# Patient Record
Sex: Male | Born: 1995 | Race: Black or African American | Hispanic: No | Marital: Single
Health system: Southern US, Community
[De-identification: ages and names within clinical notes are randomized; demographics above are authoritative.]

## PROBLEM LIST (undated history)

## (undated) DIAGNOSIS — K5090 Crohn's disease, unspecified, without complications: Secondary | ICD-10-CM

## (undated) DIAGNOSIS — J45909 Unspecified asthma, uncomplicated: Secondary | ICD-10-CM

---
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
## 2022-08-15 ENCOUNTER — Other Ambulatory Visit: Payer: Self-pay

## 2022-08-15 ENCOUNTER — Emergency Department (HOSPITAL_COMMUNITY): Payer: Self-pay

## 2022-08-15 ENCOUNTER — Emergency Department (HOSPITAL_COMMUNITY)
Admission: EM | Admit: 2022-08-15 | Discharge: 2022-08-15 | Disposition: A | Discharge status: Home / Self Care | Payer: Self-pay | Attending: Emergency Medicine | Admitting: Emergency Medicine

## 2022-08-15 ENCOUNTER — Encounter (HOSPITAL_COMMUNITY): Payer: Self-pay | Admitting: Emergency Medicine

## 2022-08-15 DIAGNOSIS — N131 Hydronephrosis with ureteral stricture, not elsewhere classified: Secondary | ICD-10-CM | POA: Insufficient documentation

## 2022-08-15 DIAGNOSIS — N201 Calculus of ureter: Secondary | ICD-10-CM

## 2022-08-15 LAB — CBC WITH DIFFERENTIAL/PLATELET
Abs Immature Granulocytes: 0.01 10*3/uL (ref 0.00–0.07)
Basophils Absolute: 0 10*3/uL (ref 0.0–0.1)
Basophils Relative: 0 %
Eosinophils Absolute: 0.1 10*3/uL (ref 0.0–0.5)
Eosinophils Relative: 2 %
HCT: 42.2 % (ref 39.0–52.0)
Hemoglobin: 14.5 g/dL (ref 13.0–17.0)
Immature Granulocytes: 0 %
Lymphocytes Relative: 21 %
Lymphs Abs: 1.1 10*3/uL (ref 0.7–4.0)
MCH: 32 pg (ref 26.0–34.0)
MCHC: 34.4 g/dL (ref 30.0–36.0)
MCV: 93.2 fL (ref 80.0–100.0)
Monocytes Absolute: 0.5 10*3/uL (ref 0.1–1.0)
Monocytes Relative: 9 %
Neutro Abs: 3.4 10*3/uL (ref 1.7–7.7)
Neutrophils Relative %: 68 %
Platelets: 246 10*3/uL (ref 150–400)
RBC: 4.53 MIL/uL (ref 4.22–5.81)
RDW: 12.4 % (ref 11.5–15.5)
WBC: 5 10*3/uL (ref 4.0–10.5)
nRBC: 0 % (ref 0.0–0.2)

## 2022-08-15 LAB — COMPREHENSIVE METABOLIC PANEL
ALT: 10 U/L (ref 0–44)
AST: 15 U/L (ref 15–41)
Albumin: 4.1 g/dL (ref 3.5–5.0)
Alkaline Phosphatase: 47 U/L (ref 38–126)
Anion gap: 7 (ref 5–15)
BUN: 9 mg/dL (ref 6–20)
CO2: 26 mmol/L (ref 22–32)
Calcium: 9 mg/dL (ref 8.9–10.3)
Chloride: 103 mmol/L (ref 98–111)
Creatinine, Ser: 0.97 mg/dL (ref 0.61–1.24)
GFR, Estimated: >60 mL/min (ref >60)
Glucose, Bld: 103 mg/dL — ABNORMAL HIGH (ref 70–99)
Potassium: 3.1 mmol/L — ABNORMAL LOW (ref 3.5–5.1)
Sodium: 136 mmol/L (ref 135–145)
Total Bilirubin: 2.6 mg/dL — ABNORMAL HIGH (ref 0.3–1.2)
Total Protein: 7.7 g/dL (ref 6.5–8.1)

## 2022-08-15 LAB — URINALYSIS, ROUTINE W REFLEX MICROSCOPIC
Bilirubin Urine: NEGATIVE
Glucose, UA: NEGATIVE mg/dL
Ketones, ur: NEGATIVE mg/dL
Leukocytes,Ua: NEGATIVE
Nitrite: NEGATIVE
Protein, ur: >=300 mg/dL — AB
RBC / HPF: >50 RBC/hpf (ref 0–5)
Specific Gravity, Urine: 1.017 (ref 1.005–1.030)
pH: 6 (ref 5.0–8.0)

## 2022-08-15 LAB — LIPASE, BLOOD: Lipase: 24 U/L (ref 11–51)

## 2022-08-15 MED ORDER — LACTATED RINGERS IV BOLUS
1000.0000 mL | Freq: Once | INTRAVENOUS | Status: AC
  Administered 2022-08-15: 1000 mL via INTRAVENOUS

## 2022-08-15 MED ORDER — OXYCODONE-ACETAMINOPHEN 5-325 MG PO TABS: 1.0000 | ORAL_TABLET | Freq: Four times a day (QID) | ORAL | 0 refills | Status: DC | PRN

## 2022-08-15 MED ORDER — HYDROMORPHONE HCL 1 MG/ML IJ SOLN
0.5000 mg | Freq: Once | INTRAMUSCULAR | Status: AC
  Administered 2022-08-15: 0.5 mg via INTRAVENOUS
  Filled 2022-08-15: qty 1

## 2022-08-15 MED ORDER — KETOROLAC TROMETHAMINE 30 MG/ML IJ SOLN
30.0000 mg | Freq: Once | INTRAMUSCULAR | Status: AC
  Administered 2022-08-15: 30 mg via INTRAVENOUS
  Filled 2022-08-15: qty 1

## 2022-08-15 MED ORDER — ONDANSETRON 4 MG PO TBDP: 4.0000 mg | ORAL_TABLET | Freq: Three times a day (TID) | ORAL | 0 refills | Status: DC | PRN

## 2022-08-15 MED ORDER — TAMSULOSIN HCL 0.4 MG PO CAPS: 0.4000 mg | ORAL_CAPSULE | Freq: Every day | ORAL | 0 refills | Status: AC

## 2022-08-15 MED ORDER — SODIUM CHLORIDE (PF) 0.9 % IJ SOLN
INTRAMUSCULAR | Status: AC
  Filled 2022-08-15: qty 50

## 2022-08-15 MED ORDER — IOHEXOL 300 MG/ML  SOLN
100.0000 mL | Freq: Once | INTRAMUSCULAR | Status: AC | PRN
  Administered 2022-08-15: 100 mL via INTRAVENOUS

## 2022-08-15 NOTE — ED Notes (Signed)
Pt given urine strainer and specimen cup for home use.

## 2022-08-15 NOTE — ED Provider Triage Note (Signed)
  Emergency Medicine Provider Triage Evaluation Note  MRN:  [id number]  Arrival date & time: 08/15/22    Medically screening exam initiated at 6:08 AM.   CC:   Pelvic Pain   HPI:  Jeffrey Wang is a 27 y.o. year-old male presents to the ED with chief complaint of periumbilical pain.  Hx of Crohn's and prior appendectomy.  States pain is severe.  Marland Kitchen  History provided by patient. ROS:  -As included in HPI PE:   Vitals:   08/15/22 0600 08/15/22 0604  BP:    Pulse:    Resp:  16  Temp: 98.3 F (36.8 C)   SpO2:      Non-toxic appearing No respiratory distress Periumbilical tenderness  MDM:  Based on signs and symptoms, Crohn's flare is highest on my differential, followed by [facility]BO. I've ordered labs and imaging in triage to expedite lab/diagnostic workup.  Patient was informed that the remainder of the evaluation will be completed by another provider, this initial triage assessment does not replace that evaluation, and the importance of remaining in the ED until their evaluation is complete.    Roxy Horseman, PA-C 08/15/22 [phone]

## 2022-08-15 NOTE — ED Notes (Signed)
Fluid/ PO challenge completed and successful.

## 2022-08-15 NOTE — ED Triage Notes (Signed)
Patient here with complaints of lower abdominal/ pelvic pain since 10 pm last night just before going to sleep. States it feels like its throbbing and right in the middle below the belly button. Patient denies urinary symptoms, discharge, pain in the groin area. States he has hx of crohns but this does not feel like a flare up in the past.

## 2022-08-15 NOTE — ED Provider Notes (Signed)
[place] EMERGENCY DEPARTMENT AT [facility] Provider Note   CSN: [id number] Arrival date & time: 08/15/22  0540     History  Chief Complaint  Patient presents with   Pelvic Pain    DASHUN BORRE is a 27 y.o. male history of Crohn's in remission s/p surgical resection, prior appendectomy here for evaluation of suprapubic and periumbilical pain.  Started yesterday evening.  Described as cramping and aching.  Does not radiate.  No pain or swelling to scrotum.  No dysuria.  Last bowel movement 4 hours PTA without difficulty.  He is passing flatus.  No fever, nausea, vomiting, diarrhea, constipation, blood in stool.  Does not follow-up with GI for Crohn's currently.  Previously followed in [place].  Does not feel similar to his prior Crohn's flares.    HPI     Home Medications Prior to Admission medications   Medication Sig Start Date End Date Taking? Authorizing Provider  ondansetron (ZOFRAN-ODT) 4 MG disintegrating tablet Take 1 tablet (4 mg total) by mouth every 8 (eight) hours as needed for nausea or vomiting. 08/15/22  Yes Rajinder Mesick A, PA-C  oxyCODONE-acetaminophen (PERCOCET/ROXICET) 5-325 MG tablet Take 1 tablet by mouth every 6 (six) hours as needed for severe pain. 08/15/22  Yes Zygmund Passero A, PA-C  tamsulosin (FLOMAX) 0.4 MG CAPS capsule Take 1 capsule (0.4 mg total) by mouth daily for 10 days. 08/15/22 08/25/22 Yes Angelus Hoopes A, PA-C  cefTRIAXone (ROCEPHIN) 1 G injection Inject 1 g into the muscle once. 11/03/13   Leonia Corona, MD  ferrous sulfate (IRON SUPPLEMENT) 325 (65 FE) MG tablet Take 1 tablet (325 mg total) by mouth 2 (two) times daily with a meal. 10/18/13   Nani Ravens, MD      Allergies    Patient has no known allergies.    Review of Systems   Review of Systems  Constitutional: Negative.   HENT: Negative.    Respiratory: Negative.    Cardiovascular: Negative.   Gastrointestinal:  Positive for abdominal pain. Negative for  abdominal distention, anal bleeding, blood in stool, constipation, diarrhea, nausea, rectal pain and vomiting.  Genitourinary: Negative.   Musculoskeletal: Negative.   Skin: Negative.   Neurological: Negative.   All other systems reviewed and are negative.   Physical Exam Updated Vital Signs BP (!) 138/91   Pulse (!) 50   Temp 98 F (36.7 C) (Oral)   Resp 16   Ht 5\' 6"  (1.676 m)   Wt 72.6 kg   SpO2 98%   BMI 25.82 kg/m  Physical Exam Vitals and nursing note reviewed.  Constitutional:      General: He is not in acute distress.    Appearance: He is well-developed. He is not ill-appearing, toxic-appearing or diaphoretic.  HENT:     Head: Normocephalic and atraumatic.     Nose: Nose normal.     Mouth/Throat:     Mouth: Mucous membranes are moist.  Eyes:     Pupils: Pupils are equal, round, and reactive to light.  Cardiovascular:     Rate and Rhythm: Normal rate and regular rhythm.     Pulses: Normal pulses.     Heart sounds: Normal heart sounds.  Pulmonary:     Effort: Pulmonary effort is normal. No respiratory distress.     Breath sounds: Normal breath sounds.  Abdominal:     General: Bowel sounds are normal. There is no distension.     Palpations: Abdomen is soft. There is no mass.  Tenderness: There is abdominal tenderness. There is no right CVA tenderness, left CVA tenderness, guarding or rebound.     Hernia: No hernia is present.     Comments: Soft, mild tenderness to suprapubic, periumbilical region.  Large, old midline incision, no erythema, warmth, drainage, obvious hernias.  Musculoskeletal:        General: Normal range of motion.     Cervical back: Normal range of motion and neck supple.  Skin:    General: Skin is warm and dry.  Neurological:     General: No focal deficit present.     Mental Status: He is alert and oriented to person, place, and time.     ED Results / Procedures / Treatments   Labs (all labs ordered are listed, but only abnormal  results are displayed) Labs Reviewed  COMPREHENSIVE METABOLIC PANEL - Abnormal; Notable for the following components:      Result Value   Potassium 3.1 (*)    Glucose, Bld 103 (*)    Total Bilirubin 2.6 (*)    All other components within normal limits  URINALYSIS, ROUTINE W REFLEX MICROSCOPIC - Abnormal; Notable for the following components:   APPearance CLOUDY (*)    Hgb urine dipstick LARGE (*)    Protein, ur >=300 (*)    Bacteria, UA RARE (*)    All other components within normal limits  LIPASE, BLOOD  CBC WITH DIFFERENTIAL/PLATELET    EKG None  Radiology CT ABDOMEN PELVIS W CONTRAST  Result Date: 08/15/2022 CLINICAL DATA:  Abdominal pain EXAM: CT ABDOMEN AND PELVIS WITH CONTRAST TECHNIQUE: Multidetector CT imaging of the abdomen and pelvis was performed using the standard protocol following bolus administration of intravenous contrast. RADIATION DOSE REDUCTION: This exam was performed according to the departmental dose-optimization program which includes automated exposure control, adjustment of the mA and/or kV according to patient size and/or use of iterative reconstruction technique. CONTRAST:  OMNIPAQUE IOHEXOL 300 MG/ML  SOLN COMPARISON:  09/24/2014 FINDINGS: Lower chest: No acute abnormality. Hepatobiliary: No focal liver abnormality is seen. No gallstones, gallbladder wall thickening, or biliary dilatation. Pancreas: Unremarkable. No pancreatic ductal dilatation or surrounding inflammatory changes. Spleen: Normal in size without focal abnormality. Adrenals/Urinary Tract: Normal adrenal glands. Motion artifact diminishes exam detail of the kidneys, ureters and bladder. Right-sided hydronephrosis and hydroureter identified. There is a stone within the right mid ureter at the bifurcation of the right common iliac artery which measures 4 mm, image 53/2. Left kidney is moderately obscured by motion artifact. Suspect left-sided hydronephrosis and hydroureter. Left retroperitoneal  calcification at the level of the inferior pole of the left kidney measures 3 mm. This was not present on the previous exam and is equivocal for ureteral calculi. There is diffuse asymmetric bladder wall thickening which measures up to 1.7 cm in thickness posteriorly, image 66/2. No calcifications identified within the urinary bladder. Stomach/Bowel: Fluid-filled distal thoracic esophagus noted. No pathologic dilatation of the large or small bowel loops. 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 No significant bowel wall thickening or inflammation. Vascular/Lymphatic: Normal caliber abdominal aorta. No signs of abdominopelvic adenopathy. Reproductive: Prostate is unremarkable. Other: No significant free fluid or fluid collections. Musculoskeletal: No acute or significant osseous findings. IMPRESSION: 1. Exam detail is diminished by motion artifact. 2. Right-sided hydronephrosis and hydroureter identified. There is a 4 mm stone within the right mid ureter at the level of the bifurcation of the right common iliac artery. 3. Suspect left-sided hydronephrosis and hydroureter. Left retroperitoneal calcification at the level  of the inferior pole of the left kidney measures 3 mm. This was not present on the previous exam and is equivocal for ureteral calculi. This is difficult to confirm with a high degree of certainty given the limitations secondary to motion artifact. 4. Diffuse asymmetric bladder wall thickening which measures up to 1.7 cm in thickness posteriorly. Correlate with urinalysis to exclude cystitis. 5. 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 Electronically Signed   By: Signa Kell M.D.   On: 08/15/2022 08:26    Procedures Procedures    Medications Ordered in ED Medications  HYDROmorphone (DILAUDID) injection 0.5 mg (0.5 mg Intravenous Given 08/15/22 0635)  lactated ringers bolus 1,000 mL (0 mLs Intravenous Stopped 08/15/22 0959)  iohexol (OMNIPAQUE)  300 MG/ML solution 100 mL (100 mLs Intravenous Contrast Given 08/15/22 0729)  ketorolac (TORADOL) 30 MG/ML injection 30 mg (30 mg Intravenous Given 08/15/22 0954)   ED Course/ Medical Decision Making/ A&P   27 year old here for evaluation of periumbilical and suprapubic abdominal pain which began yesterday evening.  Having normal bowel movements, last 4 hours PTA.  No blood in stool.  No nausea, vomiting.  Patient injury or trauma.  History of Crohn's in remission s/p resection.  Has a large midline old abdominal incision however does not appear to have erythema, warmth to suggest infectious process such as cellulitis.  No fluctuance or induration to suggest abscess.  No obvious large hernias.  Plan on labs, imaging, pain management and reassess  Labs and imaging personally viewed and interpreted:  CBC without leukocytosis Metabolic panel potassium 3.1, T. bili 2.6 Lipase 24 UA with blood, proteinuria, no infection CT abdomen and pelvis hydronephrosis, right ureter stone, possible left stone?  Difficult due to motion  Patient reassessed.  Pain improved.  Discussed labs and imaging.  Will give Toradol and p.o. challenge  Patient reassessed.  Tolerating p.o. intake.  Pain controlled.  We discussed labs and imaging.  Will start on Flomax, pain control, antiemetic at home.  He will return for any new or worsening symptoms.  Encouraged him to follow-up with urology given possible bilateral stones on imaging to make sure he passes these.  Patient is nontoxic, nonseptic appearing, in no apparent distress.  Patient's pain and other symptoms adequately managed in emergency department.  Fluid bolus given.  Labs, imaging and vitals reviewed.  Patient does not meet the SIRS or Sepsis criteria.  On repeat exam patient does not have a surgical abdomin and there are no peritoneal signs.  No indication of appendicitis, bowel obstruction, bowel perforation, cholecystitis, diverticulitis, infected kidney stone,  incarcerated/strangulated hernia.  Patient discharged home with symptomatic treatment and given strict instructions for follow-up with their primary care physician.  I have also discussed reasons to return immediately to the ER.  Patient expresses understanding and agrees with plan.                              Medical Decision Making Amount and/or Complexity of Data Reviewed External Data Reviewed: labs, radiology and notes. Labs: ordered. Decision-making details documented in ED Course. Radiology: ordered and independent interpretation performed. Decision-making details documented in ED Course.  Risk OTC drugs. Prescription drug management. Parenteral controlled substances. Decision regarding hospitalization. Diagnosis or treatment significantly limited by social determinants of health.           Final Clinical Impression(s) / ED Diagnoses Final diagnoses:  Ureteral stone    Rx / DC Orders ED Discharge  Orders          Ordered    oxyCODONE-acetaminophen (PERCOCET/ROXICET) 5-325 MG tablet  Every 6 hours PRN        08/15/22 1038    tamsulosin (FLOMAX) 0.4 MG CAPS capsule  Daily        08/15/22 1038    ondansetron (ZOFRAN-ODT) 4 MG disintegrating tablet  Every 8 hours PRN        08/15/22 1038              Johnross Nabozny A, PA-C 08/15/22 1043    Pricilla Loveless, MD 08/16/22 0725

## 2022-08-15 NOTE — Discharge Instructions (Addendum)
It appears you possibly have 2 kidney stones.  We have written you for a short course of pain medicine as well as a nausea medication.  We have also started a medication called Flomax which helps dilate your ureters to pass the stones.  Please use caution you go from sitting to standing as this medication may make you dizzy.  Make sure to dangle your legs on the edge of the bed prior to getting up.  Please follow-up with urology given you have more than 1 kidney stone to make sure these pass.  Return for new or worsening symptoms.

## 2022-09-30 ENCOUNTER — Emergency Department (HOSPITAL_COMMUNITY)
Admission: EM | Admit: 2022-09-30 | Discharge: 2022-09-30 | Disposition: A | Discharge status: Home / Self Care | Payer: Self-pay

## 2022-09-30 ENCOUNTER — Other Ambulatory Visit: Payer: Self-pay

## 2022-09-30 ENCOUNTER — Encounter (HOSPITAL_COMMUNITY): Payer: Self-pay

## 2022-09-30 ENCOUNTER — Emergency Department (HOSPITAL_COMMUNITY): Payer: Self-pay

## 2022-09-30 DIAGNOSIS — R319 Hematuria, unspecified: Secondary | ICD-10-CM

## 2022-09-30 DIAGNOSIS — N200 Calculus of kidney: Secondary | ICD-10-CM | POA: Insufficient documentation

## 2022-09-30 LAB — COMPREHENSIVE METABOLIC PANEL
ALT: 13 U/L (ref 0–44)
AST: 14 U/L — ABNORMAL LOW (ref 15–41)
Albumin: 4.4 g/dL (ref 3.5–5.0)
Alkaline Phosphatase: 51 U/L (ref 38–126)
Anion gap: 9 (ref 5–15)
BUN: 11 mg/dL (ref 6–20)
CO2: 30 mmol/L (ref 22–32)
Calcium: 9.5 mg/dL (ref 8.9–10.3)
Chloride: 98 mmol/L (ref 98–111)
Creatinine, Ser: 0.82 mg/dL (ref 0.61–1.24)
GFR, Estimated: >60 mL/min (ref >60)
Glucose, Bld: 103 mg/dL — ABNORMAL HIGH (ref 70–99)
Potassium: 4.3 mmol/L (ref 3.5–5.1)
Sodium: 137 mmol/L (ref 135–145)
Total Bilirubin: 1.2 mg/dL (ref 0.3–1.2)
Total Protein: 8.5 g/dL — ABNORMAL HIGH (ref 6.5–8.1)

## 2022-09-30 LAB — LIPASE, BLOOD: Lipase: 22 U/L (ref 11–51)

## 2022-09-30 LAB — CBC
HCT: 47.2 % (ref 39.0–52.0)
Hemoglobin: 15.9 g/dL (ref 13.0–17.0)
MCH: 31.5 pg (ref 26.0–34.0)
MCHC: 33.7 g/dL (ref 30.0–36.0)
MCV: 93.5 fL (ref 80.0–100.0)
Platelets: 277 10*3/uL (ref 150–400)
RBC: 5.05 MIL/uL (ref 4.22–5.81)
RDW: 12.4 % (ref 11.5–15.5)
WBC: 4.6 10*3/uL (ref 4.0–10.5)
nRBC: 0 % (ref 0.0–0.2)

## 2022-09-30 LAB — URINALYSIS, ROUTINE W REFLEX MICROSCOPIC
Bilirubin Urine: NEGATIVE
Glucose, UA: NEGATIVE mg/dL
Ketones, ur: NEGATIVE mg/dL
Leukocytes,Ua: NEGATIVE
Nitrite: NEGATIVE
Protein, ur: 100 mg/dL — AB
RBC / HPF: >50 RBC/hpf (ref 0–5)
Specific Gravity, Urine: 1.017 (ref 1.005–1.030)
pH: 7 (ref 5.0–8.0)

## 2022-09-30 MED ORDER — KETOROLAC TROMETHAMINE 30 MG/ML IJ SOLN
30.0000 mg | Freq: Once | INTRAMUSCULAR | Status: AC | PRN
  Administered 2022-09-30: 30 mg via INTRAVENOUS
  Filled 2022-09-30: qty 1

## 2022-09-30 MED ORDER — ETODOLAC 400 MG PO TABS: 400.0000 mg | ORAL_TABLET | Freq: Two times a day (BID) | ORAL | 0 refills | Status: DC

## 2022-09-30 MED ORDER — ONDANSETRON HCL 4 MG/2ML IJ SOLN
4.0000 mg | Freq: Once | INTRAMUSCULAR | Status: AC | PRN
  Administered 2022-09-30: 4 mg via INTRAVENOUS
  Filled 2022-09-30: qty 2

## 2022-09-30 NOTE — ED Triage Notes (Signed)
Left lower abdominal pain that radiates into flank. Hx of kidney stones and feels the same. States he had blood in urine and dark colored urine. Sx started this AM.

## 2022-09-30 NOTE — ED Provider Notes (Signed)
[place] EMERGENCY DEPARTMENT AT [facility] Provider Note   CSN: [id number] Arrival date & time: 09/30/22  1253     History  Chief Complaint  Patient presents with   Abdominal Pain    Jeffrey Wang is a 27 y.o. male who presents emergency department chief complaint of flank pain.  Patient reports that he woke up this morning with pain in the left flank and then urinated with blood in his urine.  He has a history of previous kidney stone.  His pain was severe earlier but has resolved at this time.  He denies any vomiting but had some nausea earlier.   Abdominal Pain      Home Medications Prior to Admission medications   Medication Sig Start Date End Date Taking? Authorizing Provider  cefTRIAXone (ROCEPHIN) 1 G injection Inject 1 g into the muscle once. 11/03/13   Leonia Corona, MD  ferrous sulfate (IRON SUPPLEMENT) 325 (65 FE) MG tablet Take 1 tablet (325 mg total) by mouth 2 (two) times daily with a meal. 10/18/13   Nani Ravens, MD  ondansetron (ZOFRAN-ODT) 4 MG disintegrating tablet Take 1 tablet (4 mg total) by mouth every 8 (eight) hours as needed for nausea or vomiting. 08/15/22   Henderly, Britni A, PA-C  oxyCODONE-acetaminophen (PERCOCET/ROXICET) 5-325 MG tablet Take 1 tablet by mouth every 6 (six) hours as needed for severe pain. 08/15/22   Henderly, Britni A, PA-C      Allergies    Patient has no known allergies.    Review of Systems   Review of Systems  Gastrointestinal:  Positive for abdominal pain.    Physical Exam Updated Vital Signs BP (!) 141/98 (BP Location: Left Arm)   Pulse 65   Temp 97.8 F (36.6 C) (Oral)   Resp 16   Ht 5\' 6"  (1.676 m)   Wt 59 kg   SpO2 100%   BMI 20.98 kg/m  Physical Exam Physical Exam  Nursing note and vitals reviewed. Constitutional: He appears well-developed and well-nourished. No distress.  HENT:  Head: Normocephalic and atraumatic.  Eyes: Conjunctivae normal are normal. No scleral icterus.  Neck:  Normal range of motion. Neck supple.  Cardiovascular: Normal rate, regular rhythm and normal heart sounds.   Pulmonary/Chest: Effort normal and breath sounds normal. No respiratory distress.  Abdominal: Soft. There is no tenderness.  No CVA tenderness Musculoskeletal: He exhibits no edema.  Neurological: He is alert.  Skin: Skin is warm and dry. He is not diaphoretic.  Psychiatric: His behavior is normal.   ED Results / Procedures / Treatments   Labs (all labs ordered are listed, but only abnormal results are displayed) Labs Reviewed  URINALYSIS, ROUTINE W REFLEX MICROSCOPIC - Abnormal; Notable for the following components:      Result Value   APPearance CLOUDY (*)    Hgb urine dipstick LARGE (*)    Protein, ur 100 (*)    Bacteria, UA RARE (*)    All other components within normal limits  COMPREHENSIVE METABOLIC PANEL - Abnormal; Notable for the following components:   Glucose, Bld 103 (*)    Total Protein 8.5 (*)    AST 14 (*)    All other components within normal limits  LIPASE, BLOOD  CBC    EKG None  Radiology No results found.  Procedures Procedures    Medications Ordered in ED Medications  ketorolac (TORADOL) 30 MG/ML injection 30 mg (has no administration in time range)  ondansetron (ZOFRAN) injection 4 mg (  has no administration in time range)    ED Course/ Medical Decision Making/ A&P                                 Medical Decision Making Patient here with flank pain and Hematuria., The differential diagnosis for hematuria includes but is not limited to cystitis, urinary calculi, BPH, renal cell carcinoma, transitional cell carcinoma, glomerulonephritis, polycystic kidney disease, anticoagulant usage, prostate cancer, papillary necrosis (EGD in sig abuse, DM, sickle cell trait or disease), renal infarction, interstitial nephritis, medullary sponge kidney, radiation or chemical cystitis (e.g. Cyclophosphamide), atrophic vaginitis, schistosomiasis, menses,  urethra his, urethral diverticula.  I ordered and interpreted labs.  Lipase CMP CBC without significant abnormality.  Urine appears to show a large volume of hemoglobin.  Few white and rare bacteria.  He has no other signs or symptoms of urinary tract infection including urgency frequency foul odor or burning with urination.  I suspect a recently passed stone.  I ordered and interpreted CT renal stone study which showed bilateral nephrolithiasis without evidence of ureteral calculi.  Patient comfortable with discharge at this time with anti-inflammatories.  I do not think he needs treatment for urinary tract infection.  Discussed follow-up and return precautions with the patient who is appropriate for discharge at this time  Amount and/or Complexity of Data Reviewed Labs: ordered. Radiology: ordered.  Risk Prescription drug management.             Final Clinical Impression(s) / ED Diagnoses Final diagnoses:  None    Rx / DC Orders ED Discharge Orders     None         Arthor Captain, PA-C 09/30/22 2253    Durwin Glaze, MD 10/01/22 0030

## 2022-09-30 NOTE — ED Notes (Signed)
EDPA into see, at BS.   

## 2022-09-30 NOTE — ED Notes (Addendum)
Alert, NAD, calm, steady gait. C/o LLQ pain, rates 6/10, better than previously, but returning. Nausea resolved. Denies vomiting or diarrhea. Last BM last night. Last ate 0100.

## 2022-09-30 NOTE — Discharge Instructions (Addendum)
Contact a health care provider if:  You have pain that gets worse or does not get better with medicine.  Get help right away if:  You have a fever or chills.  You develop severe pain.  You develop new abdominal pain.  You faint.  You are unable to urinate.

## 2023-02-06 ENCOUNTER — Emergency Department (HOSPITAL_COMMUNITY)
Admission: EM | Admit: 2023-02-06 | Discharge: 2023-02-06 | Disposition: A | Discharge status: Home / Self Care | Payer: Self-pay | Attending: Emergency Medicine | Admitting: Emergency Medicine

## 2023-02-06 DIAGNOSIS — W228XXA Striking against or struck by other objects, initial encounter: Secondary | ICD-10-CM | POA: Insufficient documentation

## 2023-02-06 DIAGNOSIS — S00501A Unspecified superficial injury of lip, initial encounter: Secondary | ICD-10-CM | POA: Insufficient documentation

## 2023-02-06 DIAGNOSIS — S0993XA Unspecified injury of face, initial encounter: Secondary | ICD-10-CM

## 2023-02-06 MED ORDER — IBUPROFEN 400 MG PO TABS
600.0000 mg | ORAL_TABLET | Freq: Once | ORAL | Status: AC
  Administered 2023-02-06: 600 mg via ORAL
  Filled 2023-02-06: qty 1
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
## 2023-02-06 NOTE — Discharge Instructions (Signed)
Take over-the-counter medications as needed for pain.  Apply ice to help with swelling.
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
## 2024-01-28 ENCOUNTER — Encounter (HOSPITAL_COMMUNITY): Payer: Self-pay | Admitting: Emergency Medicine

## 2024-01-28 ENCOUNTER — Emergency Department (HOSPITAL_COMMUNITY): Admission: EM | Admit: 2024-01-28 | Discharge: 2024-01-28 | Discharge status: Against Medical Advice

## 2024-01-28 ENCOUNTER — Emergency Department (HOSPITAL_COMMUNITY): Admission: EM | Admit: 2024-01-28 | Discharge: 2024-01-28 | Disposition: A | Discharge status: Home / Self Care

## 2024-01-28 ENCOUNTER — Other Ambulatory Visit: Payer: Self-pay

## 2024-01-28 DIAGNOSIS — K0889 Other specified disorders of teeth and supporting structures: Secondary | ICD-10-CM | POA: Diagnosis present

## 2024-01-28 DIAGNOSIS — K029 Dental caries, unspecified: Secondary | ICD-10-CM | POA: Insufficient documentation

## 2024-01-28 DIAGNOSIS — K047 Periapical abscess without sinus: Secondary | ICD-10-CM | POA: Diagnosis not present

## 2024-01-28 MED ORDER — NAPROXEN 500 MG PO TABS
500.0000 mg | ORAL_TABLET | Freq: Once | ORAL | Status: AC
  Administered 2024-01-28: 500 mg via ORAL
  Filled 2024-01-28: qty 1

## 2024-01-28 MED ORDER — NAPROXEN 500 MG PO TABS: 500.0000 mg | ORAL_TABLET | Freq: Two times a day (BID) | ORAL | 0 refills | Status: AC

## 2024-01-28 MED ORDER — AMOXICILLIN-POT CLAVULANATE 875-125 MG PO TABS
1.0000 | ORAL_TABLET | Freq: Once | ORAL | Status: AC
  Administered 2024-01-28: 1 via ORAL
  Filled 2024-01-28: qty 1

## 2024-01-28 MED ORDER — AMOXICILLIN-POT CLAVULANATE 875-125 MG PO TABS: 1.0000 | ORAL_TABLET | Freq: Two times a day (BID) | ORAL | 0 refills | Status: AC

## 2024-01-28 NOTE — ED Triage Notes (Signed)
 Pt presents with right upper dental pain x a few days. Swelling noted. No fever or chills.  Requesting x-ray.  EDPA at bedside.

## 2024-01-28 NOTE — ED Provider Notes (Signed)
 " [place] EMERGENCY DEPARTMENT AT [facility] Provider Note   CSN: [id number] Arrival date & time: 01/28/24  1605     Patient presents with: Dental Pain   Jeffrey Wang is a 28 y.o. male.   Patient is a 28 year old male who presents emergency department the chief complaint of pain to the right upper gumline and swelling to the right maxillary region.  He notes that this has been ongoing for the past few days.  He denies any associated fever, chills, stridor, dysphagia, drooling, dyspnea.  He notes that he does have a bad tooth in the noted area.  He has had no abdominal pain, nausea, vomiting.   Dental Pain Associated symptoms: facial swelling        Prior to Admission medications  Medication Sig Start Date End Date Taking? Authorizing Provider  amoxicillin-clavulanate (AUGMENTIN) 875-125 MG tablet Take 1 tablet by mouth every 12 (twelve) hours. 01/28/24  Yes Daralene Bruckner D, PA-C  naproxen (NAPROSYN) 500 MG tablet Take 1 tablet (500 mg total) by mouth 2 (two) times daily. 01/28/24  Yes Daralene Bruckner BIRCH, PA-C    Allergies: Patient has no known allergies.    Review of Systems  HENT:  Positive for dental problem and facial swelling.   All other systems reviewed and are negative.   Updated Vital Signs BP (!) 140/78   Pulse 80   Temp 98 F (36.7 C) (Oral)   Resp 17   SpO2 100%   Physical Exam Vitals and nursing note reviewed.  Constitutional:      General: He is not in acute distress.    Appearance: Normal appearance. He is not ill-appearing.  HENT:     Head: Normocephalic and atraumatic.     Nose: Nose normal.     Mouth/Throat:     Mouth: Mucous membranes are moist.     Comments: Swelling noted to the right upper gumline with dental caries noted to the right third molar, floor mouth is soft, tolerant secretion without difficulty, no induration or fluctuance, no peritonsillar swelling, mild swelling noted to the right maxillary region Eyes:      Extraocular Movements: Extraocular movements intact.     Conjunctiva/sclera: Conjunctivae normal.     Pupils: Pupils are equal, round, and reactive to light.  Cardiovascular:     Rate and Rhythm: Normal rate and regular rhythm.     Pulses: Normal pulses.     Heart sounds: Normal heart sounds.  Pulmonary:     Effort: Pulmonary effort is normal.     Breath sounds: Normal breath sounds.  Musculoskeletal:        General: Normal range of motion.  Skin:    General: Skin is warm and dry.  Neurological:     General: No focal deficit present.     Mental Status: He is alert and oriented to person, place, and time. Mental status is at baseline.     (all labs ordered are listed, but only abnormal results are displayed) Labs Reviewed - No data to display  EKG: None  Radiology: No results found.   Procedures   Medications Ordered in the ED  amoxicillin-clavulanate (AUGMENTIN) 875-125 MG per tablet 1 tablet (has no administration in time range)  naproxen (NAPROSYN) tablet 500 mg (has no administration in time range)  Medical Decision Making Patient is doing well at this time and is stable for discharge home.  Discussed with patient we will treat him for dental infection at this point.  He has no indication for obvious drainable abscess at this time.  Discussed with patient that imaging is not warranted at this point as this will not add to the dispo.  Discussed the need for close follow-up with a dentist.  He has no indication for acute peritonsillar abscess, Ludwig's angina, retropharyngeal abscess, epiglottitis.  He has no periorbital involvement.  He has no signs of acute airway compromise.  Vital signs are stable with no indication for sepsis.  Dental resources were provided.  Will cover with antibiotics and NSAIDs.  Strict turn precautions were discussed for any new or worsening symptoms.  Patient voiced understanding and had no additional  questions.  Risk Prescription drug management.        Final diagnoses:  Dental infection    ED Discharge Orders          Ordered    amoxicillin-clavulanate (AUGMENTIN) 875-125 MG tablet  Every 12 hours        01/28/24 1636    naproxen (NAPROSYN) 500 MG tablet  2 times daily        01/28/24 1636               Daralene Lonni BIRCH, PA-C 01/28/24 1638  "

## 2024-01-28 NOTE — ED Notes (Signed)
 Patient requested to leave, moved OTF

## 2024-01-28 NOTE — Discharge Instructions (Addendum)
 Please take all antibiotics as directed.  Follow-up closely with a dentist on an outpatient basis.  Return to emergency department immediately for any new or worsening symptoms.
